# Patient Record
Sex: Female | Born: 1935 | Race: White | Hispanic: No | Marital: Single | Smoking: Former smoker
Health system: Southern US, Community
[De-identification: ages and names within clinical notes are randomized; demographics above are authoritative.]

## PROBLEM LIST (undated history)

## (undated) DIAGNOSIS — F32A Depression, unspecified: Secondary | ICD-10-CM

## (undated) DIAGNOSIS — I639 Cerebral infarction, unspecified: Secondary | ICD-10-CM

## (undated) DIAGNOSIS — F329 Major depressive disorder, single episode, unspecified: Secondary | ICD-10-CM

## (undated) HISTORY — PX: BREAST CYST ASPIRATION: SHX578

## (undated) HISTORY — PX: TONSILLECTOMY: SUR1361

## (undated) HISTORY — PX: ABDOMINAL HYSTERECTOMY: SHX81

---

## 2012-06-20 ENCOUNTER — Emergency Department (INDEPENDENT_AMBULATORY_CARE_PROVIDER_SITE_OTHER)
Admission: EM | Admit: 2012-06-20 | Discharge: 2012-06-20 | Disposition: A | Discharge status: Home / Self Care | Payer: Medicare Other | Source: Home / Self Care | Attending: Family Medicine | Admitting: Family Medicine

## 2012-06-20 ENCOUNTER — Emergency Department (INDEPENDENT_AMBULATORY_CARE_PROVIDER_SITE_OTHER): Payer: Medicare Other

## 2012-06-20 ENCOUNTER — Encounter: Payer: Self-pay | Admitting: *Deleted

## 2012-06-20 DIAGNOSIS — R0902 Hypoxemia: Secondary | ICD-10-CM

## 2012-06-20 DIAGNOSIS — J441 Chronic obstructive pulmonary disease with (acute) exacerbation: Secondary | ICD-10-CM

## 2012-06-20 DIAGNOSIS — J449 Chronic obstructive pulmonary disease, unspecified: Secondary | ICD-10-CM

## 2012-06-20 HISTORY — DX: Major depressive disorder, single episode, unspecified: F32.9

## 2012-06-20 HISTORY — DX: Depression, unspecified: F32.A

## 2012-06-20 HISTORY — DX: Cerebral infarction, unspecified: I63.9

## 2012-06-20 MED ORDER — IPRATROPIUM-ALBUTEROL 0.5-2.5 (3) MG/3ML IN SOLN
3.0000 mL | Freq: Once | RESPIRATORY_TRACT | Status: AC
  Administered 2012-06-20: 3 mL via RESPIRATORY_TRACT

## 2012-06-20 MED ORDER — AZITHROMYCIN 250 MG PO TABS: ORAL_TABLET | ORAL | Status: DC

## 2012-06-20 MED ORDER — PREDNISONE 50 MG PO TABS: ORAL_TABLET | ORAL | Status: DC

## 2012-06-20 MED ORDER — METHYLPREDNISOLONE SODIUM SUCC 125 MG IJ SOLR
125.0000 mg | Freq: Once | INTRAMUSCULAR | Status: AC
  Administered 2012-06-20: 125 mg via INTRAMUSCULAR

## 2012-06-20 NOTE — ED Provider Notes (Signed)
History     CSN: [id number]  Arrival date & time 06/20/12  1016   First MD Initiated Contact with Patient 06/20/12 1020      Chief Complaint  Patient presents with  . Cough  . Nasal Congestion  . Fatigue   HPI  URI Symptoms Onset: 2-3 days Description: rhinorrhea, nasal congestion, cough, wheezing  Modifying factors:  COPD   Symptoms Nasal discharge: yes Fever: no Sore throat: no Cough: yes Wheezing: yes Ear pain: no GI symptoms: no Sick contacts: yes  Red Flags  Stiff neck: no Dyspnea: mild Rash: no Swallowing difficulty: no  Sinusitis Risk Factors Headache/face pain: no Double sickening: no tooth pain: no  Allergy Risk Factors Sneezing: yes Itchy scratchy throat: yes Seasonal symptoms: yes  Flu Risk Factors Headache: no muscle aches: yes severe fatigue: yes    Past Medical History  Diagnosis Date  . CVA (cerebral infarction)   . Depression     Past Surgical History  Procedure Laterality Date  . Tonsillectomy    . Breast cyst aspiration    . Abdominal hysterectomy      Family History  Problem Relation Age of Onset  . Cancer Mother     colon  . Cancer Father     lung  . Heart attack Father     History  Substance Use Topics  . Smoking status: Former Games developer  . Smokeless tobacco: Not on file  . Alcohol Use: No    OB History   Grav Para Term Preterm Abortions TAB SAB Ect Mult Living                  Review of Systems  All other systems reviewed and are negative.    Allergies  Dilantin; Flexeril; Levaquin; Penicillins; Skelaxin; Sulfa antibiotics; and Tetracyclines & related  Home Medications   Current Outpatient Rx  Name  Route  Sig  Dispense  Refill  . alendronate (FOSAMAX) 70 MG tablet   Oral   Take 70 mg by mouth every 7 (seven) days. Take with a full glass of water on an empty stomach.         Marland Kitchen aspirin 81 MG tablet   Oral   Take 81 mg by mouth daily.         Marland Kitchen atorvastatin (LIPITOR) 20 MG tablet   Oral   Take 20 mg by mouth daily.         . calcium carbonate (OS-CAL) 600 MG TABS   Oral   Take 600 mg by mouth 2 (two) times daily with a meal.         . dipyridamole-aspirin (AGGRENOX) 200-25 MG per 12 hr capsule   Oral   Take 1 capsule by mouth 2 (two) times daily.         Marland Kitchen escitalopram (LEXAPRO) 10 MG tablet   Oral   Take 10 mg by mouth daily.         . fluticasone (FLONASE) 50 MCG/ACT nasal spray   Nasal   Place 2 sprays into the nose daily.         Marland Kitchen gabapentin (NEURONTIN) 300 MG capsule   Oral   Take 300 mg by mouth 3 (three) times daily.         Marland Kitchen HYDROcodone-acetaminophen (VICODIN) 5-500 MG per tablet   Oral   Take 1 tablet by mouth every 6 (six) hours as needed for pain.         Marland Kitchen levothyroxine (SYNTHROID, LEVOTHROID) 75 MCG  tablet   Oral   Take 75 mcg by mouth daily before breakfast.         . meclizine (ANTIVERT) 25 MG tablet   Oral   Take 25 mg by mouth 3 (three) times daily as needed.         . montelukast (SINGULAIR) 10 MG tablet   Oral   Take 10 mg by mouth at bedtime.         . Potassium Gluconate 595 MG TBCR   Oral   Take by mouth.           BP 101/60  Pulse 90  Temp(Src) 98.2 F (36.8 C) (Oral)  Resp 18  Wt 126 lb (57.153 kg)  SpO2 90%  Physical Exam  Constitutional: She appears well-developed and well-nourished.  HENT:  Head: Normocephalic and atraumatic.  Eyes: Conjunctivae are normal. Pupils are equal, round, and reactive to light.  Neck: Normal range of motion. Neck supple.  Cardiovascular: Normal rate, regular rhythm and normal heart sounds.   Pulmonary/Chest: Effort normal. She has wheezes. She has no rales.  Abdominal: Soft.  Musculoskeletal: Normal range of motion.  Neurological: She is alert.  Skin: Skin is warm.    ED Course  Procedures (including critical care time)  Labs Reviewed - No data to display Dg Chest 2 View  06/20/2012  *RADIOLOGY REPORT*  Clinical Data: Cough and shortness of breath   CHEST - 2 VIEW  Comparison: None.  Findings: The heart and pulmonary vascularity are within normal limits.  The lungs are hyperinflated bilaterally without focal infiltrate.  Minimal left basilar atelectasis is seen.  No effusion is noted.  Mild degenerative change of the thoracic spine is seen.  IMPRESSION: COPD with mild left basilar atelectasis.   Original Report Authenticated By: Alcide Clever, M.D.      1. COPD exacerbation   2. Hypoxia       MDM  Suspect likely viral induced COPD exacerbation. Some allergic overlap. Rapid flu negative. No infiltrate on chest x-ray. Patient given DuoNeb treatment. Noted improvement in aeration status post DuoNeb treatment. Patient noted to have chronic hypoxia despite improved aeration with O2 sats in the mid 80s to low 90s. This persisted even with patient being at rest. I suspect the patient likely lives here in the setting of chronic COPD. However, I cannot obtain an ABG here to say where the patient has a compensated respiratory acidosis. Case was discussed with patient's pulmonologist at [facility] chest. Will refer patient to her primary care doctor as they do not have any spots available for her to be seen today. Patient may benefit from being placed on home oxygen. Patient has a same-day point with her primary care doctor 4 PM today. Discussed infectious and respiratory red flags at length with patient. Respiratory status is overall fairly stable today. Patient is able to speak in full senses with minimal distress. Will place patient on course of azithromycin and prednisone for COPD exacerbation treatment. Solu-Medrol 125 mg IM x1 today. Noted multiple allergies including fluoroquinolones and tetracyclines. Followup as needed.    The patient and/or caregiver has been counseled thoroughly with regard to treatment plan and/or medications prescribed including dosage, schedule, interactions, rationale for use, and possible side effects and they verbalize  understanding. Diagnoses and expected course of recovery discussed and will return if not improved as expected or if the condition worsens. Patient and/or caregiver verbalized understanding.             Doree Albee, MD  06/20/12 1550 

## 2012-06-20 NOTE — ED Notes (Signed)
Pt c/o cough, nasal congestion, and fatigue x 5 days. Denies fever. She has taken an old rx for cough syrup and tylenol with some relief.

## 2013-08-26 IMAGING — CR DG CHEST 2V
2 series · 2 of 2 positions shown · non-contrast
Comparison: None.

CLINICAL DATA: Cough and shortness of breath

CHEST - 2 VIEW

[view not recorded (1 of 2)]
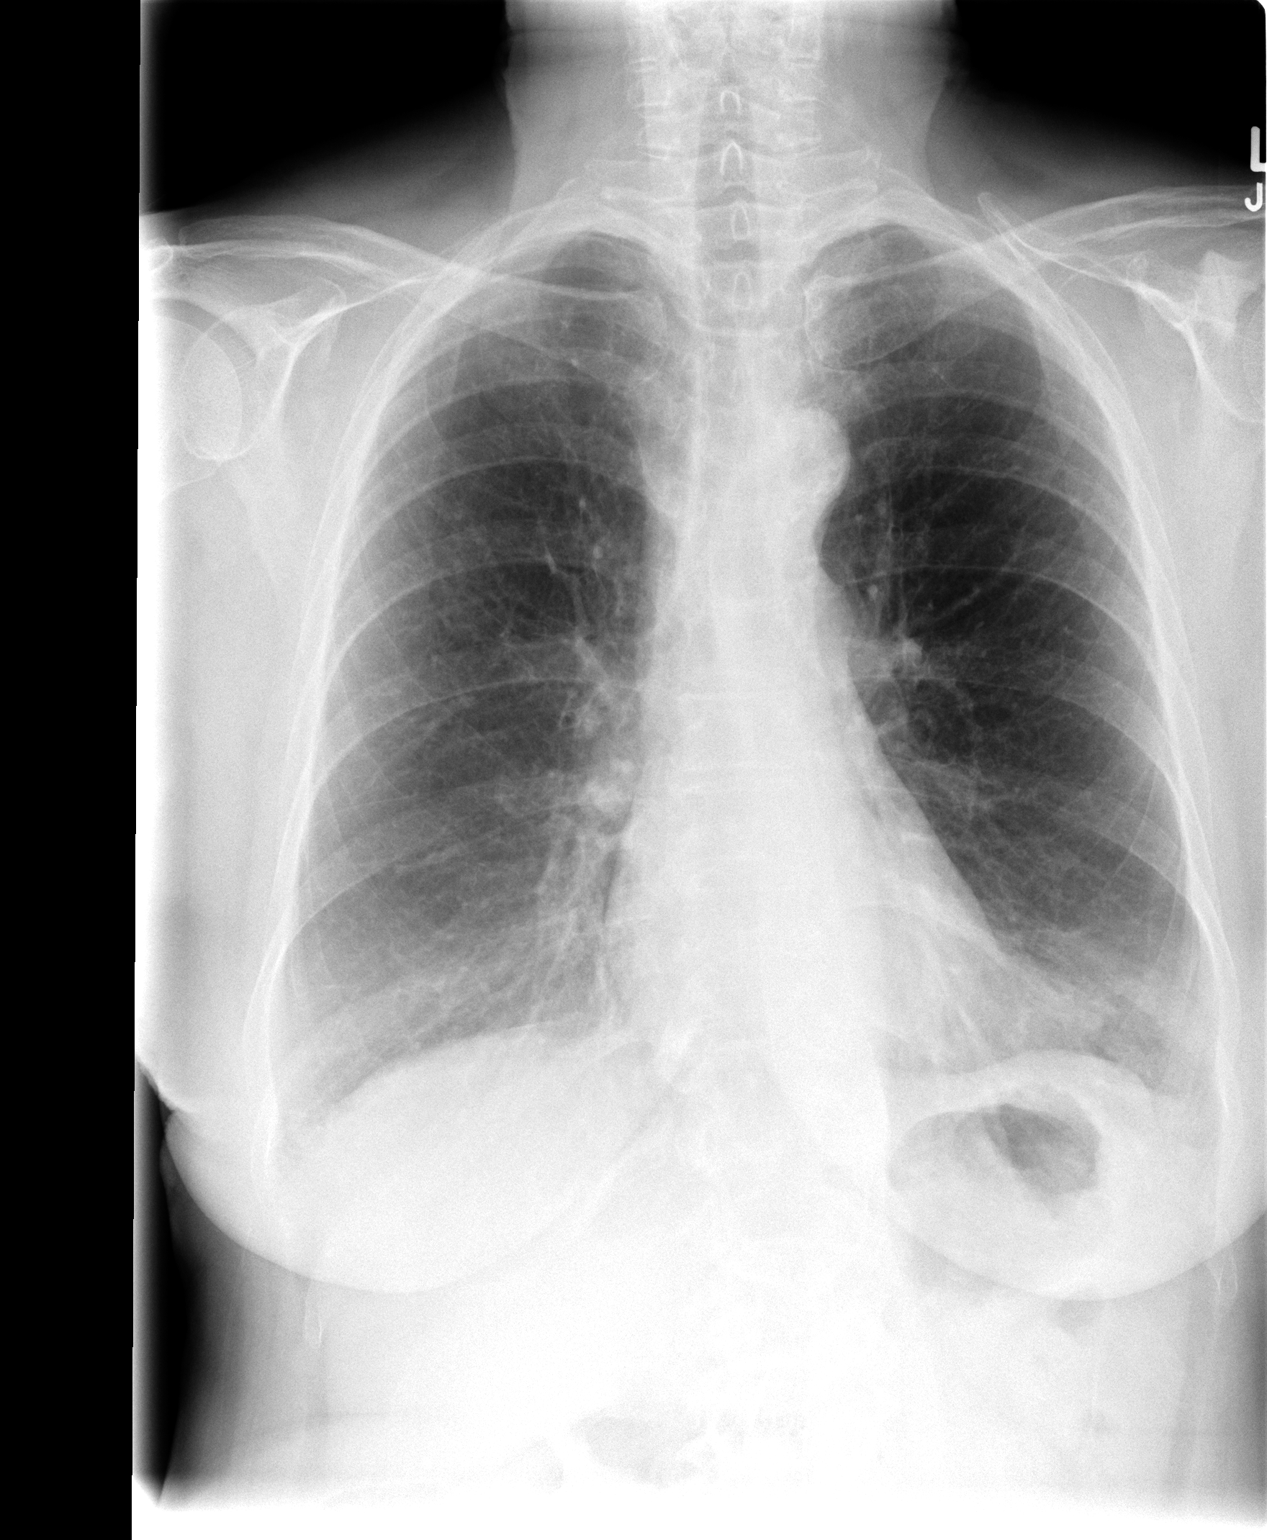

[view not recorded (2 of 2)]
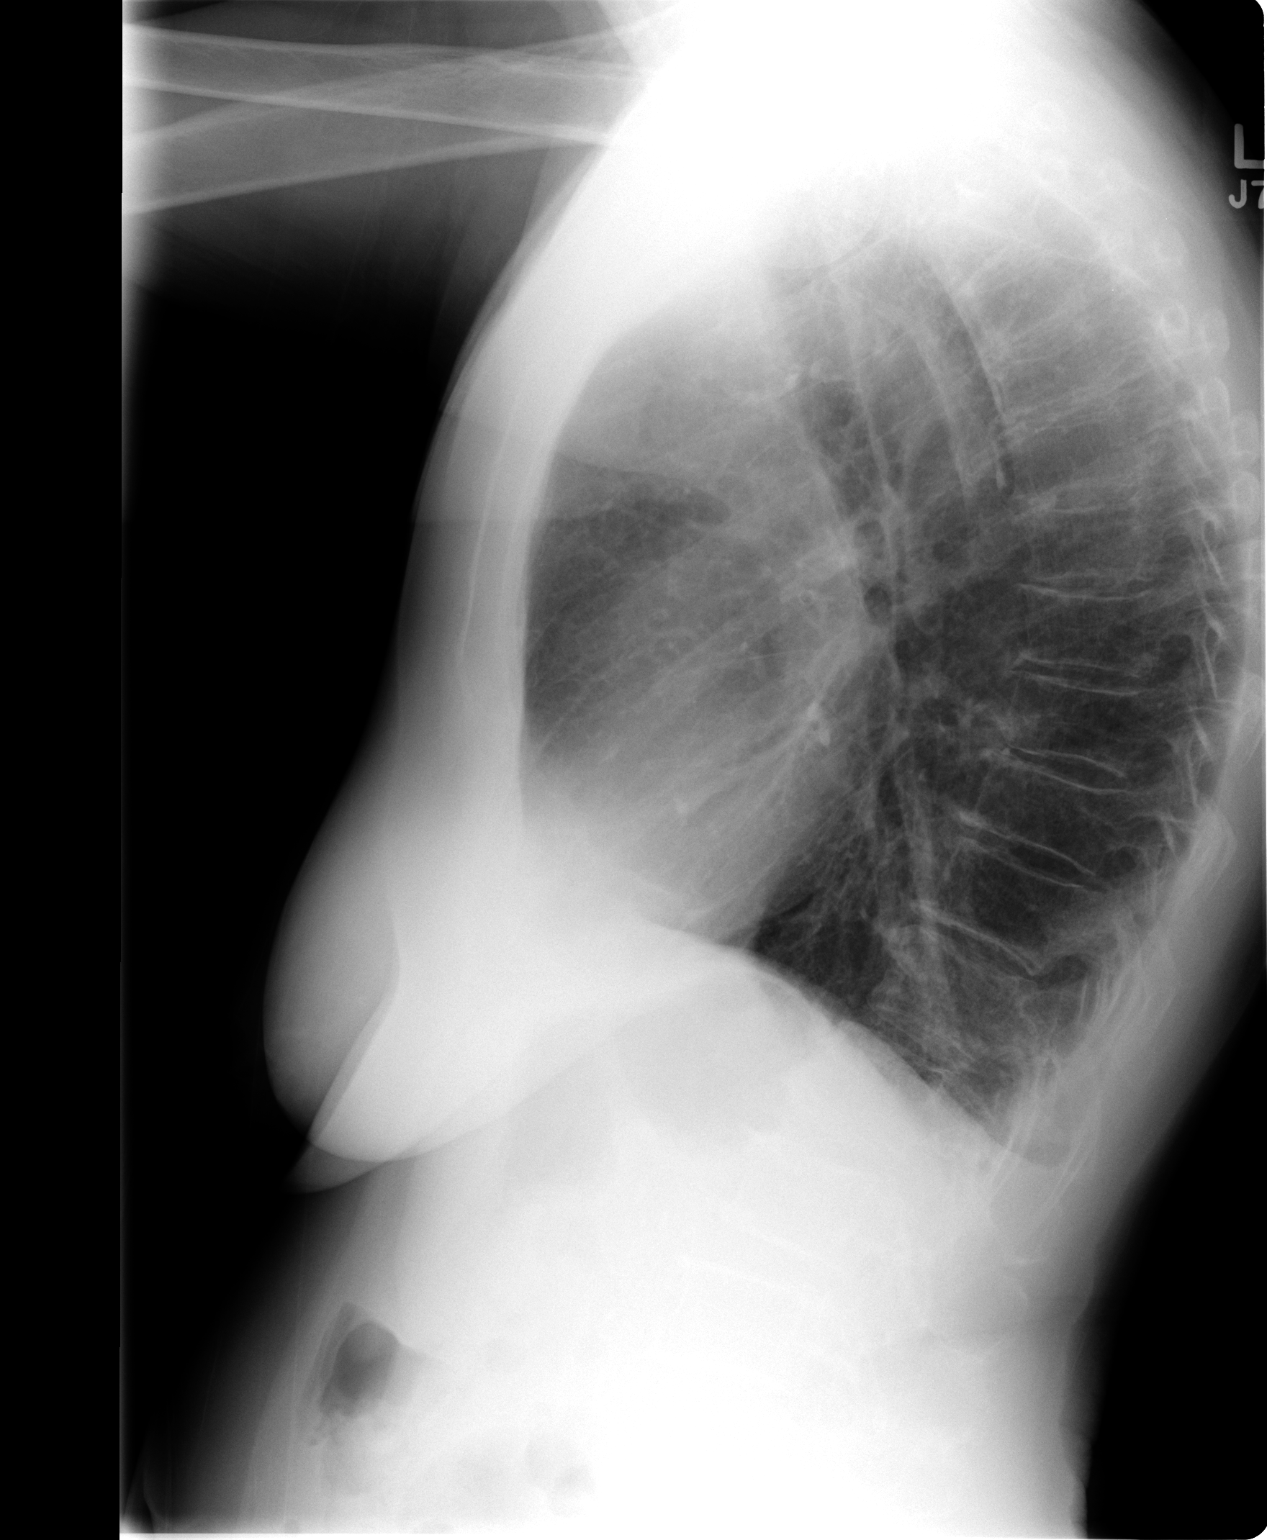

[2 of 2 positions shown; findings below may reference images not displayed]

FINDINGS: The heart and pulmonary vascularity are within normal
limits.  The lungs are hyperinflated bilaterally without focal
infiltrate.  Minimal left basilar atelectasis is seen.  No effusion
is noted.  Mild degenerative change of the thoracic spine is seen.
IMPRESSION: COPD with mild left basilar atelectasis.

## 2016-01-10 ENCOUNTER — Encounter: Payer: Self-pay | Admitting: *Deleted

## 2016-01-10 ENCOUNTER — Emergency Department (INDEPENDENT_AMBULATORY_CARE_PROVIDER_SITE_OTHER)
Admission: EM | Admit: 2016-01-10 | Discharge: 2016-01-10 | Disposition: A | Discharge status: Home / Self Care | Payer: Medicare Other | Source: Home / Self Care | Attending: Family Medicine | Admitting: Family Medicine

## 2016-01-10 ENCOUNTER — Emergency Department (INDEPENDENT_AMBULATORY_CARE_PROVIDER_SITE_OTHER): Payer: Medicare Other

## 2016-01-10 DIAGNOSIS — J9811 Atelectasis: Secondary | ICD-10-CM | POA: Diagnosis not present

## 2016-01-10 DIAGNOSIS — J441 Chronic obstructive pulmonary disease with (acute) exacerbation: Secondary | ICD-10-CM | POA: Diagnosis not present

## 2016-01-10 DIAGNOSIS — J449 Chronic obstructive pulmonary disease, unspecified: Secondary | ICD-10-CM | POA: Diagnosis not present

## 2016-01-10 DIAGNOSIS — Z87891 Personal history of nicotine dependence: Secondary | ICD-10-CM | POA: Diagnosis not present

## 2016-01-10 MED ORDER — AZITHROMYCIN 250 MG PO TABS: 250.0000 mg | ORAL_TABLET | Freq: Every day | ORAL | 0 refills | Status: AC

## 2016-01-10 MED ORDER — PREDNISONE 20 MG PO TABS: ORAL_TABLET | ORAL | 0 refills | Status: AC

## 2016-01-10 MED ORDER — METHYLPREDNISOLONE SODIUM SUCC 40 MG IJ SOLR: 40.0000 mg | Freq: Once | INTRAMUSCULAR | Status: DC

## 2016-01-10 MED ORDER — IPRATROPIUM-ALBUTEROL 0.5-2.5 (3) MG/3ML IN SOLN
3.0000 mL | Freq: Four times a day (QID) | RESPIRATORY_TRACT | Status: DC
  Administered 2016-01-10 (×2): 3 mL via RESPIRATORY_TRACT

## 2016-01-10 NOTE — ED Notes (Signed)
Error on 16:20 neb incorrectly charted.

## 2016-01-10 NOTE — ED Provider Notes (Signed)
CSN: [id number]     Arrival date & time 01/10/16  1500 History   First MD Initiated Contact with Patient 01/10/16 1541     Chief Complaint  Patient presents with  . Cough   (Consider location/radiation/quality/duration/timing/severity/associated sxs/prior Treatment) HPI  Langston MaskerGlenda Cangelosi is a 80 y.o. female presenting to UC with c/o gradually worsening mildly productive cough with clear sputum. Pt has hx of COPD.  Mild SOB w/o relief of her home inhalers.  She has taken tylenol for subjective fever. Denies sick contacts or recent travel.  Denies n/v/d.    Past Medical History:  Diagnosis Date  . CVA (cerebral infarction)   . Depression    Past Surgical History:  Procedure Laterality Date  . ABDOMINAL HYSTERECTOMY    . BREAST CYST ASPIRATION    . TONSILLECTOMY     Family History  Problem Relation Age of Onset  . Cancer Mother     colon  . Cancer Father     lung  . Heart attack Father    Social History  Substance Use Topics  . Smoking status: Former Games developermoker  . Smokeless tobacco: Never Used  . Alcohol use No   OB History    No data available     Review of Systems  Constitutional: Negative for chills and fever.  HENT: Positive for congestion and rhinorrhea. Negative for ear pain, sinus pressure, sore throat, trouble swallowing and voice change.   Respiratory: Positive for cough, chest tightness, shortness of breath and wheezing.   Cardiovascular: Negative for chest pain and palpitations.  Gastrointestinal: Negative for abdominal pain, diarrhea, nausea and vomiting.  Musculoskeletal: Negative for arthralgias, back pain and myalgias.  Skin: Negative for rash.    Allergies  Dilantin [phenytoin sodium extended]; Flexeril [cyclobenzaprine]; Levaquin [levofloxacin in d5w]; Penicillins; Skelaxin [metaxalone]; Sulfa antibiotics; Tetracyclines & related; and Prednisone  Home Medications   Prior to Admission medications   Medication Sig Start Date End Date Taking? Authorizing  Provider  alendronate (FOSAMAX) 70 MG tablet Take 70 mg by mouth every 7 (seven) days. Take with a full glass of water on an empty stomach.   Yes Historical Provider, MD  aspirin 81 MG tablet Take 81 mg by mouth daily.   Yes Historical Provider, MD  Biotin [id number] MCG TABS Take by mouth.   Yes Historical Provider, MD  calcium carbonate (OS-CAL) 600 MG TABS Take 600 mg by mouth 2 (two) times daily with a meal.   Yes Historical Provider, MD  cyanocobalamin 2000 MCG tablet Take by mouth daily.   Yes Historical Provider, MD  escitalopram (LEXAPRO) 10 MG tablet Take 10 mg by mouth daily.   Yes Historical Provider, MD  fluticasone (FLONASE) 50 MCG/ACT nasal spray Place 2 sprays into the nose daily.   Yes Historical Provider, MD  gabapentin (NEURONTIN) 300 MG capsule Take 300 mg by mouth 3 (three) times daily.   Yes Historical Provider, MD  levETIRAcetam (KEPPRA) 500 MG tablet Take 500 mg by mouth 2 (two) times daily.   Yes Historical Provider, MD  levothyroxine (SYNTHROID, LEVOTHROID) 75 MCG tablet Take 75 mcg by mouth daily before breakfast.   Yes Historical Provider, MD  montelukast (SINGULAIR) 10 MG tablet Take 10 mg by mouth at bedtime.   Yes Historical Provider, MD  oxybutynin (DITROPAN) 5 MG tablet Take 5 mg by mouth 3 (three) times daily.   Yes Historical Provider, MD  traZODone (DESYREL) 100 MG tablet Take 100 mg by mouth at bedtime.   Yes Historical Provider, MD  azithromycin (ZITHROMAX) 250 MG tablet Take 1 tablet (250 mg total) by mouth daily. Take first 2 tablets together, then 1 every day until finished. 01/10/16   Junius FinnerErin O'Malley, PA-C  dipyridamole-aspirin (AGGRENOX) 200-25 MG per 12 hr capsule Take 1 capsule by mouth 2 (two) times daily.    Historical Provider, MD  HYDROcodone-acetaminophen (VICODIN) 5-500 MG per tablet Take 1 tablet by mouth every 6 (six) hours as needed for pain.    Historical Provider, MD  meclizine (ANTIVERT) 25 MG tablet Take 25 mg by mouth 3 (three) times daily as needed.     Historical Provider, MD  predniSONE (DELTASONE) 20 MG tablet 3 tabs po day one, then 2 po daily x 4 days 01/10/16   Junius FinnerErin O'Malley, PA-C   Meds Ordered and Administered this Visit   Medications  ipratropium-albuterol (DUONEB) 0.5-2.5 (3) MG/3ML nebulizer solution 3 mL (3 mLs Nebulization Given 01/10/16 1620)    BP 163/67 (BP Location: Left Arm)   Pulse 78   Temp 98.2 F (36.8 C) (Oral)   Resp 16   Wt 118 lb (53.5 kg)   SpO2 96%  No data found.   Physical Exam  Constitutional: She appears well-developed and well-nourished. No distress.  HENT:  Head: Normocephalic and atraumatic.  Right Ear: Tympanic membrane normal.  Left Ear: Tympanic membrane normal.  Nose: Nose normal.  Mouth/Throat: Uvula is midline, oropharynx is clear and moist and mucous membranes are normal.  Eyes: Conjunctivae are normal. No scleral icterus.  Neck: Normal range of motion. Neck supple.  Cardiovascular: Normal rate, regular rhythm and normal heart sounds.   Pulmonary/Chest: Effort normal. No respiratory distress. She has decreased breath sounds. She has wheezes ( faint, diffuse). She has rhonchi in the right lower field. She has no rales.  Abdominal: Soft. She exhibits no distension. There is no tenderness.  Musculoskeletal: Normal range of motion.  Neurological: She is alert.  Skin: Skin is warm and dry. She is not diaphoretic.  Nursing note and vitals reviewed.   Urgent Care Course   Clinical Course    Procedures (including critical care time)  Labs Review Labs Reviewed - No data to display  Imaging Review Dg Chest 2 View  Result Date: 01/10/2016 CLINICAL DATA:  Productive verified headaches for 3 days, occasional shortness of breath, history COPD, stroke, former smoker EXAM: CHEST  2 VIEW COMPARISON:  06/20/2012 FINDINGS: Normal heart size, mediastinal contours, and pulmonary vascularity. Atherosclerotic calcification aorta. Emphysematous changes and minimal bibasilar atelectasis. Lungs  otherwise clear. No pleural effusion or pneumothorax. Bones diffusely demineralized. IMPRESSION: COPD changes with minimal bibasilar atelectasis. Electronically Signed   By: Ulyses SouthwardMark  Boles M.D.   On: 01/10/2016 16:46     MDM   1. COPD exacerbation (HCC)    Pt c/o worsening cough and SOB, hx of COPD.  O2 Sat 96% on RA.  CXR: COPD changes with minimal bibasilar atelectasis.  Duoneb given in UC. Rx: Azithromycin and prednisone Encouraged f/u with PCP next week if not improving, sooner if worsening. Patient verbalized understanding and agreement with treatment plan.     Junius Finnerrin O'Malley, PA-C 01/10/16 1930

## 2016-01-10 NOTE — ED Triage Notes (Signed)
Pt c/o productive clear cough and aches x 3 days. SOB at times. H/o COPD. Taking tylenol otc.

## 2017-03-17 IMAGING — DX DG CHEST 2V
2 series · 2 of 2 positions shown · non-contrast
Comparison: 06/20/2012

CLINICAL DATA: Productive verified headaches for 3 days, occasional
shortness of breath, history COPD, stroke, former smoker

EXAM:
CHEST  2 VIEW

[chest pa]
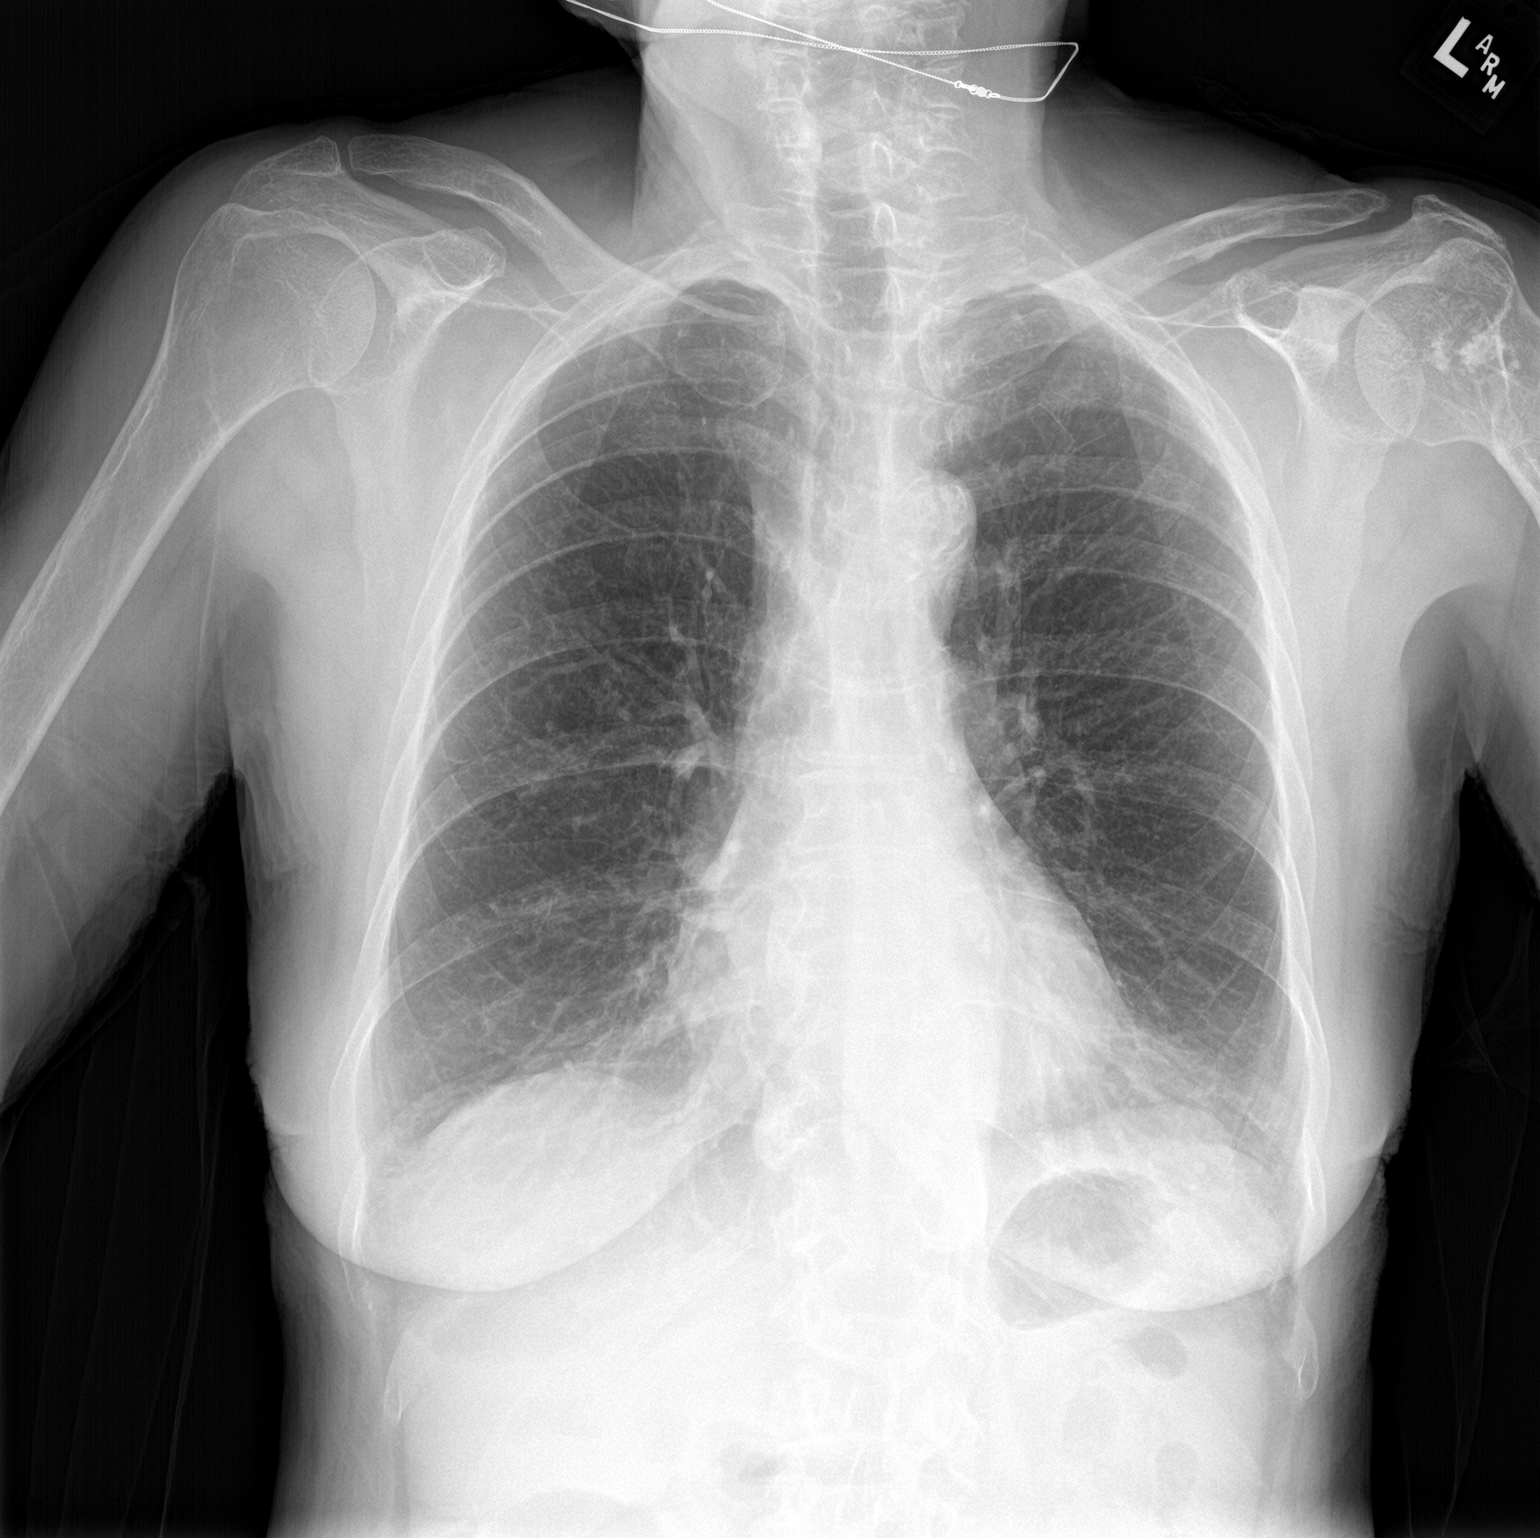

[chest lat]
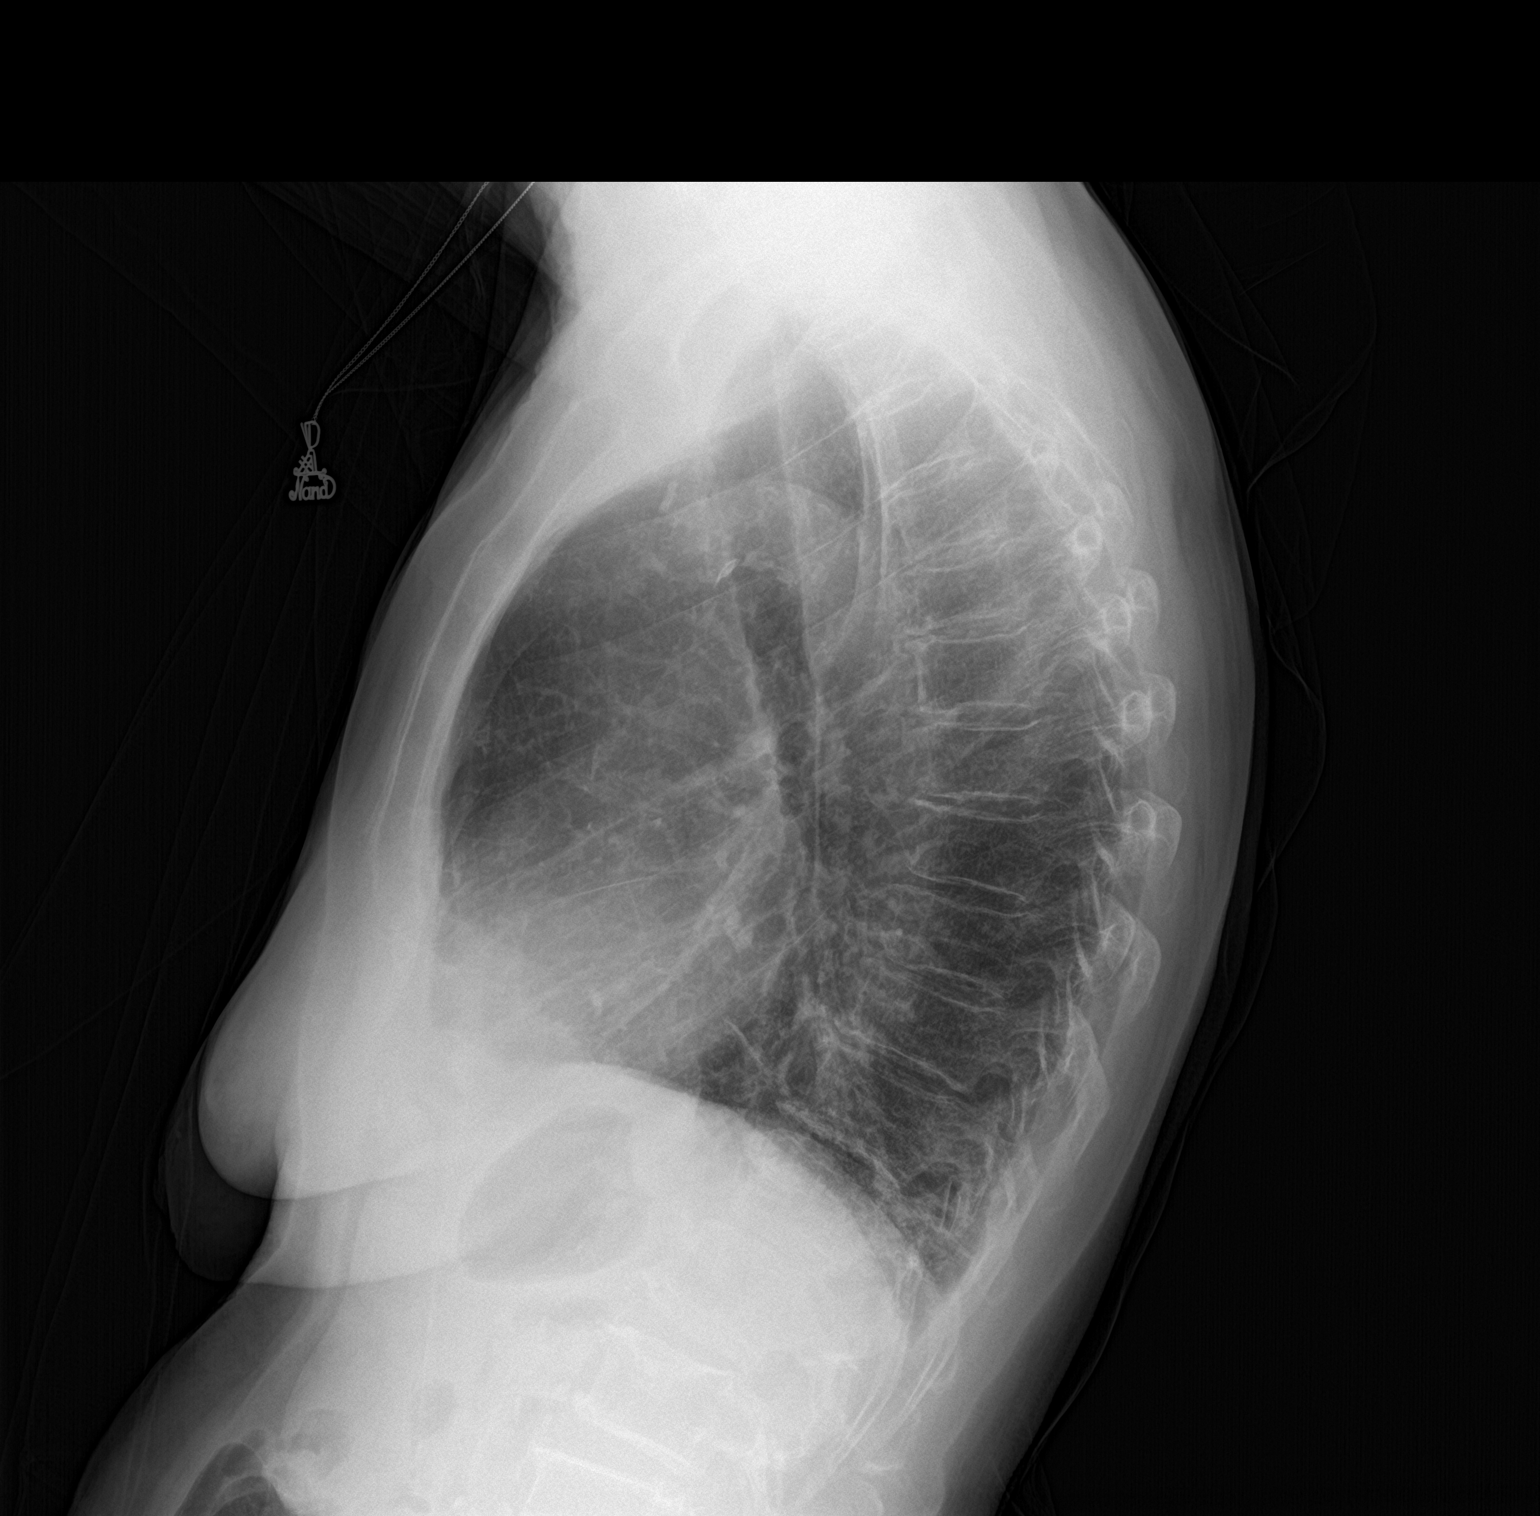

[2 of 2 positions shown; findings below may reference images not displayed]

FINDINGS: Normal heart size, mediastinal contours, and pulmonary vascularity.

Atherosclerotic calcification aorta.

Emphysematous changes and minimal bibasilar atelectasis.

Lungs otherwise clear.

No pleural effusion or pneumothorax.

Bones diffusely demineralized.
IMPRESSION: COPD changes with minimal bibasilar atelectasis.

## 2024-01-08 DEATH — deceased
